# Patient Record
Sex: Male | Born: 2000 | Race: White | Hispanic: No | Marital: Single | Smoking: Never smoker
Health system: Southern US, Community
[De-identification: ages and names within clinical notes are randomized; demographics above are authoritative.]

## PROBLEM LIST (undated history)

## (undated) DIAGNOSIS — F909 Attention-deficit hyperactivity disorder, unspecified type: Secondary | ICD-10-CM

---
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
## 2021-03-05 ENCOUNTER — Ambulatory Visit
Admission: EM | Admit: 2021-03-05 | Discharge: 2021-03-05 | Disposition: A | Discharge status: Home / Self Care | Payer: BC Managed Care – PPO | Attending: Sports Medicine | Admitting: Sports Medicine

## 2021-03-05 ENCOUNTER — Other Ambulatory Visit: Payer: Self-pay

## 2021-03-05 DIAGNOSIS — R7611 Nonspecific reaction to tuberculin skin test without active tuberculosis: Secondary | ICD-10-CM

## 2021-03-05 HISTORY — DX: Attention-deficit hyperactivity disorder, unspecified type: F90.9

## 2021-03-05 MED ORDER — TUBERCULIN PPD 5 UNIT/0.1ML ID SOLN
5.0000 [IU] | Freq: Once | INTRADERMAL | Status: DC
  Administered 2021-03-05: 5 [IU] via INTRADERMAL

## 2021-03-05 NOTE — ED Triage Notes (Signed)
Pt is here wanting a TB test, [facility] states its [facility].

## 2021-03-07 ENCOUNTER — Other Ambulatory Visit: Payer: Self-pay

## 2021-03-07 ENCOUNTER — Ambulatory Visit
Admission: EM | Admit: 2021-03-07 | Discharge: 2021-03-07 | Disposition: A | Discharge status: Home / Self Care | Payer: Self-pay

## 2021-03-07 DIAGNOSIS — R7611 Nonspecific reaction to tuberculin skin test without active tuberculosis: Secondary | ICD-10-CM
